# Patient Record
Sex: Female | Born: 1955 | Hispanic: No | Marital: Married | Smoking: Never smoker
Health system: Southern US, Community
[De-identification: ages and names within clinical notes are randomized; demographics above are authoritative.]

## PROBLEM LIST (undated history)

## (undated) DIAGNOSIS — K5790 Diverticulosis of intestine, part unspecified, without perforation or abscess without bleeding: Secondary | ICD-10-CM

## (undated) DIAGNOSIS — M1990 Unspecified osteoarthritis, unspecified site: Secondary | ICD-10-CM

## (undated) DIAGNOSIS — E039 Hypothyroidism, unspecified: Secondary | ICD-10-CM

## (undated) HISTORY — DX: Diverticulosis of intestine, part unspecified, without perforation or abscess without bleeding: K57.90

---

## 2006-12-02 ENCOUNTER — Encounter: Admission: RE | Admit: 2006-12-02 | Discharge: 2006-12-02 | Payer: Self-pay | Admitting: Hematology and Oncology

## 2007-08-14 ENCOUNTER — Encounter: Payer: Self-pay | Admitting: Internal Medicine

## 2007-08-16 ENCOUNTER — Encounter: Payer: Self-pay | Admitting: Internal Medicine

## 2007-08-24 ENCOUNTER — Encounter: Payer: Self-pay | Admitting: Internal Medicine

## 2007-08-26 ENCOUNTER — Encounter: Payer: Self-pay | Admitting: Internal Medicine

## 2008-05-11 DIAGNOSIS — Z8719 Personal history of other diseases of the digestive system: Secondary | ICD-10-CM | POA: Insufficient documentation

## 2008-05-11 DIAGNOSIS — K449 Diaphragmatic hernia without obstruction or gangrene: Secondary | ICD-10-CM | POA: Insufficient documentation

## 2008-05-11 DIAGNOSIS — R1013 Epigastric pain: Secondary | ICD-10-CM | POA: Insufficient documentation

## 2008-05-16 ENCOUNTER — Ambulatory Visit: Payer: Self-pay | Admitting: Internal Medicine

## 2008-05-30 ENCOUNTER — Other Ambulatory Visit: Admission: RE | Admit: 2008-05-30 | Discharge: 2008-05-30 | Payer: Self-pay | Admitting: Obstetrics and Gynecology

## 2008-06-01 ENCOUNTER — Telehealth: Payer: Self-pay | Admitting: Internal Medicine

## 2008-06-04 ENCOUNTER — Encounter: Payer: Self-pay | Admitting: Internal Medicine

## 2008-06-04 ENCOUNTER — Ambulatory Visit: Payer: Self-pay | Admitting: Internal Medicine

## 2008-06-06 ENCOUNTER — Encounter: Payer: Self-pay | Admitting: Internal Medicine

## 2008-07-24 ENCOUNTER — Ambulatory Visit (HOSPITAL_COMMUNITY): Admission: RE | Admit: 2008-07-24 | Discharge: 2008-07-24 | Payer: Self-pay | Admitting: Gynecology

## 2008-07-27 ENCOUNTER — Ambulatory Visit: Admission: RE | Admit: 2008-07-27 | Discharge: 2008-07-27 | Payer: Self-pay | Admitting: Internal Medicine

## 2008-08-13 HISTORY — PX: LAPAROSCOPIC CHOLECYSTECTOMY: SUR755

## 2008-08-28 ENCOUNTER — Encounter: Payer: Self-pay | Admitting: Gynecology

## 2008-08-28 ENCOUNTER — Ambulatory Visit (HOSPITAL_COMMUNITY): Admission: RE | Admit: 2008-08-28 | Discharge: 2008-08-28 | Payer: Self-pay | Admitting: Gynecology

## 2008-12-18 ENCOUNTER — Encounter (INDEPENDENT_AMBULATORY_CARE_PROVIDER_SITE_OTHER): Payer: Self-pay | Admitting: Interventional Radiology

## 2008-12-18 ENCOUNTER — Ambulatory Visit (HOSPITAL_COMMUNITY): Admission: RE | Admit: 2008-12-18 | Discharge: 2008-12-18 | Payer: Self-pay | Admitting: Hematology and Oncology

## 2009-06-20 ENCOUNTER — Encounter: Admission: RE | Admit: 2009-06-20 | Discharge: 2009-06-20 | Payer: Self-pay | Admitting: Hematology and Oncology

## 2009-07-13 HISTORY — PX: COLONOSCOPY: SHX174

## 2009-07-17 ENCOUNTER — Encounter: Admission: RE | Admit: 2009-07-17 | Discharge: 2009-07-17 | Payer: Self-pay | Admitting: Internal Medicine

## 2009-08-21 ENCOUNTER — Other Ambulatory Visit: Admission: RE | Admit: 2009-08-21 | Discharge: 2009-08-21 | Payer: Self-pay | Admitting: Obstetrics and Gynecology

## 2009-09-10 ENCOUNTER — Ambulatory Visit (HOSPITAL_COMMUNITY): Admission: RE | Admit: 2009-09-10 | Discharge: 2009-09-10 | Payer: Self-pay | Admitting: Obstetrics and Gynecology

## 2010-03-13 ENCOUNTER — Ambulatory Visit (HOSPITAL_COMMUNITY): Admission: RE | Admit: 2010-03-13 | Discharge: 2010-03-13 | Payer: Self-pay | Admitting: Internal Medicine

## 2010-05-22 ENCOUNTER — Encounter: Admission: RE | Admit: 2010-05-22 | Discharge: 2010-05-22 | Payer: Self-pay | Admitting: Internal Medicine

## 2010-07-25 ENCOUNTER — Encounter
Admission: RE | Admit: 2010-07-25 | Discharge: 2010-07-25 | Payer: Self-pay | Source: Home / Self Care | Attending: Internal Medicine | Admitting: Internal Medicine

## 2010-08-03 ENCOUNTER — Encounter: Payer: Self-pay | Admitting: Internal Medicine

## 2010-08-28 ENCOUNTER — Other Ambulatory Visit (HOSPITAL_COMMUNITY)
Admission: RE | Admit: 2010-08-28 | Discharge: 2010-08-28 | Disposition: A | Discharge status: Home / Self Care | Payer: 59 | Source: Ambulatory Visit | Attending: Obstetrics and Gynecology | Admitting: Obstetrics and Gynecology

## 2010-08-28 ENCOUNTER — Other Ambulatory Visit: Payer: Self-pay | Admitting: Obstetrics and Gynecology

## 2010-08-28 DIAGNOSIS — Z01419 Encounter for gynecological examination (general) (routine) without abnormal findings: Secondary | ICD-10-CM | POA: Insufficient documentation

## 2010-10-01 IMAGING — US US PELVIS COMPLETE MODIFY
1 series · 14 of 25 positions shown · non-contrast
Comparison: July 24, 2008

CLINICAL DATA: Fibroids

TRANSABDOMINAL AND TRANSVAGINAL ULTRASOUND OF PELVIS
TECHNIQUE: Both transabdominal and transvaginal ultrasound
examinations of the pelvis were performed including evaluation of
the uterus, ovaries, adnexal regions, and pelvic cul-de-sac.

[Series 1: us pelvis complete modify · 0.21mm/px · 14 of 63 slices shown]
[im 1/63]
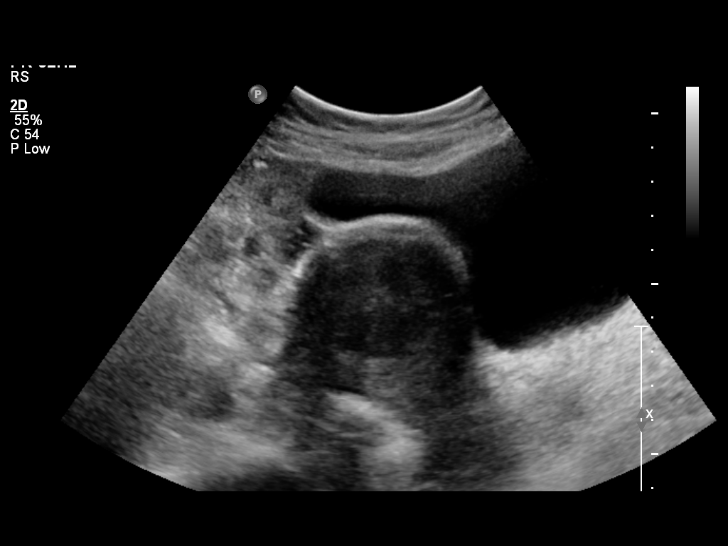
[im 6/63]
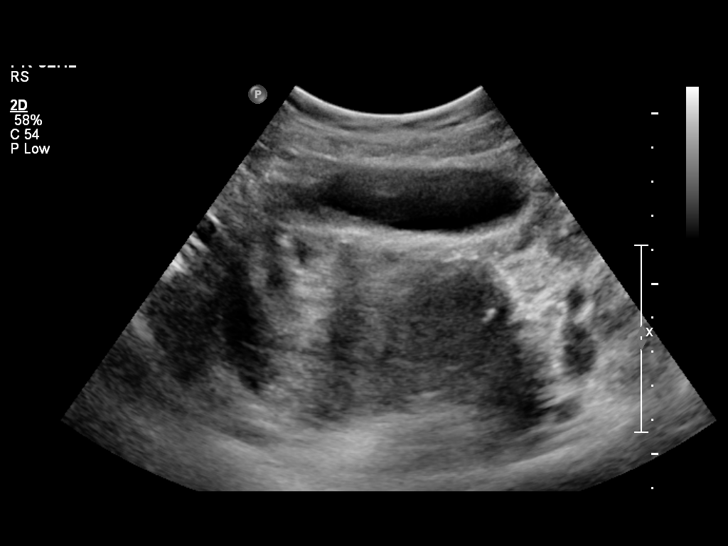
[im 11/63]
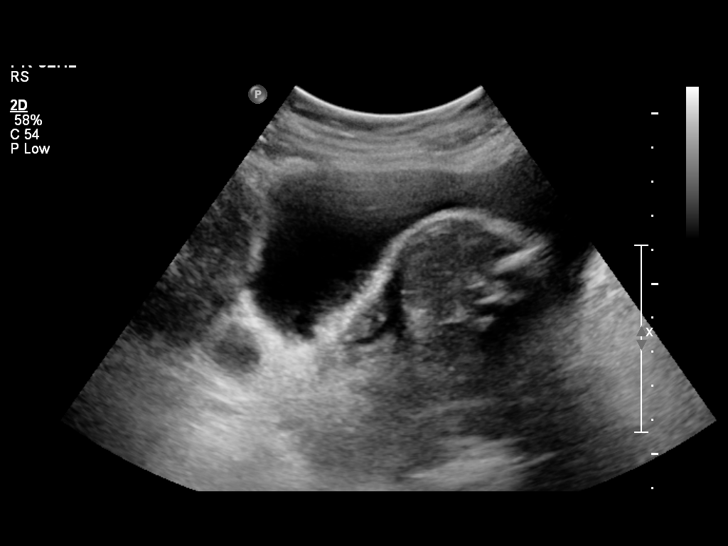
[im 16/63]
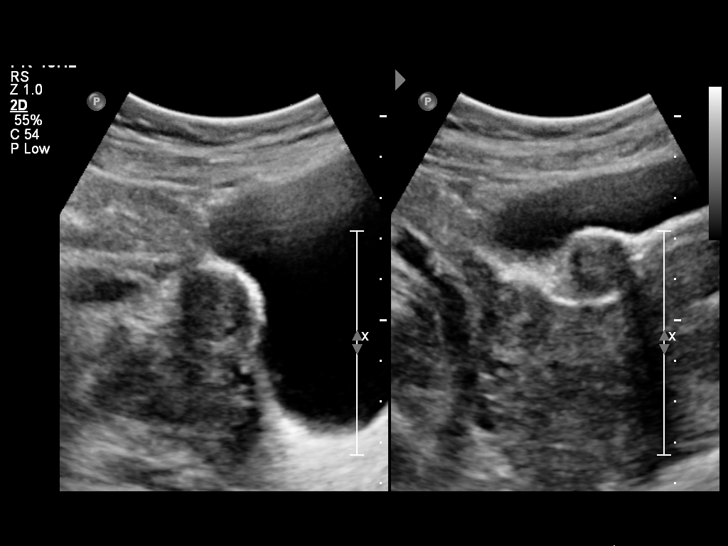
[im 21/63]
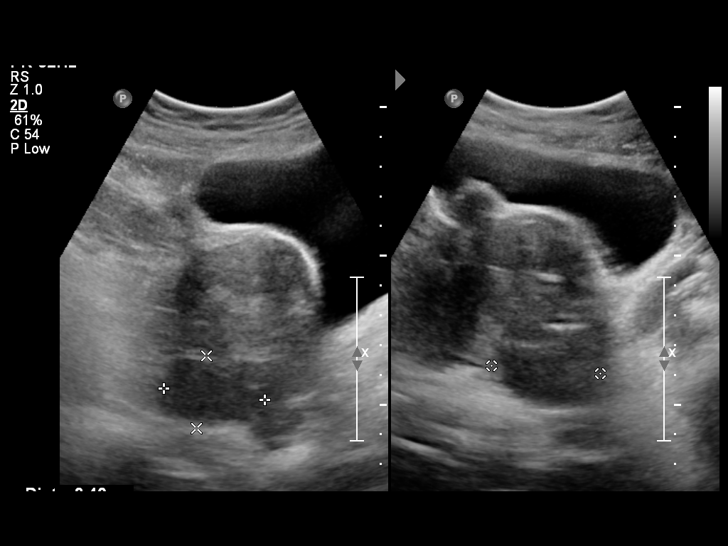
[im 24/63]
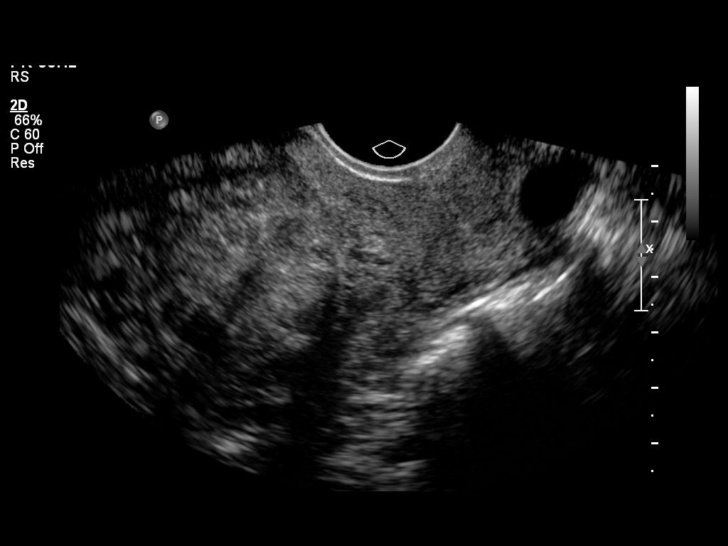
[im 29/63]
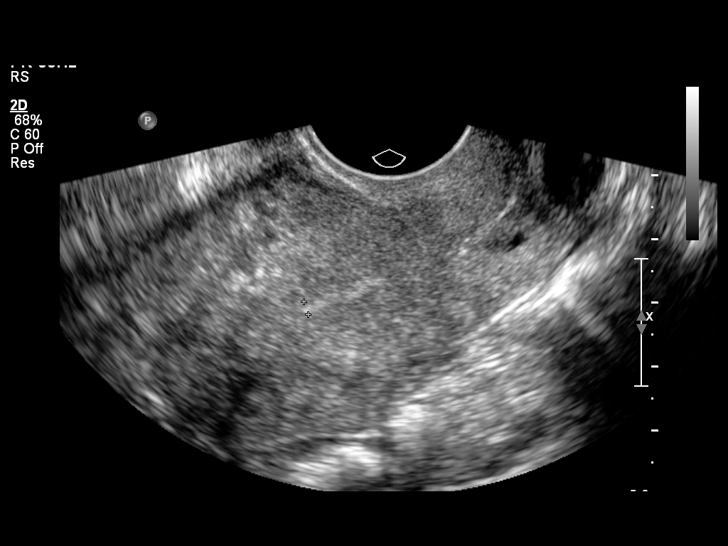
[im 34/63]
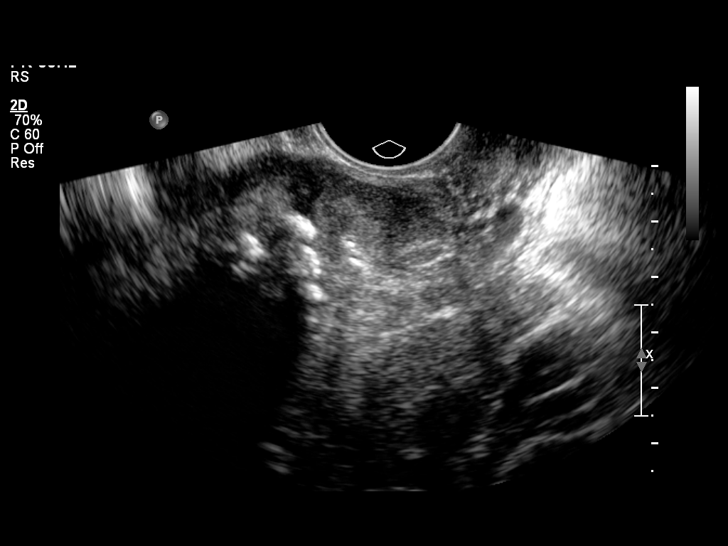
[im 39/63]
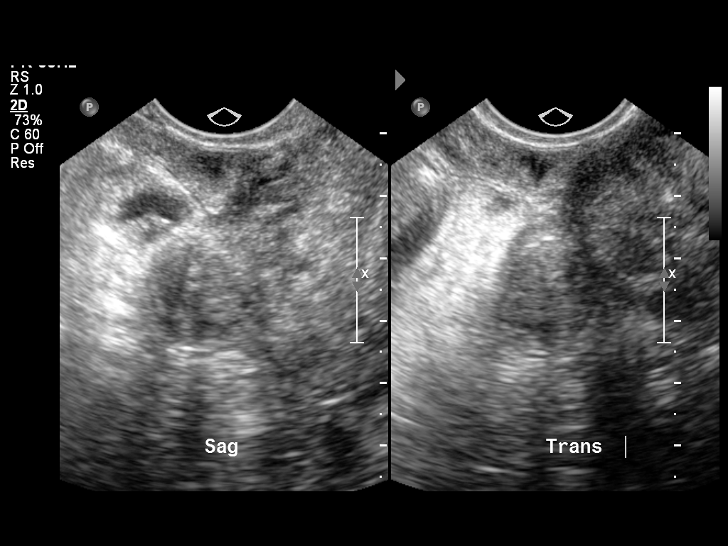
[im 42/63]
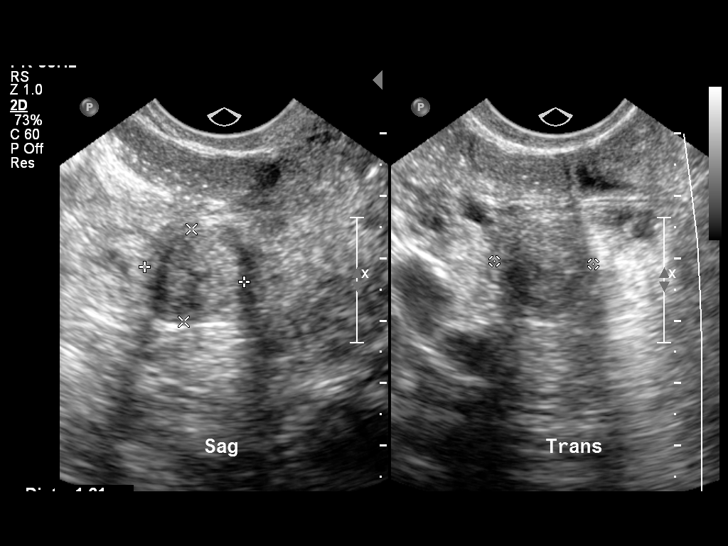
[im 47/63]
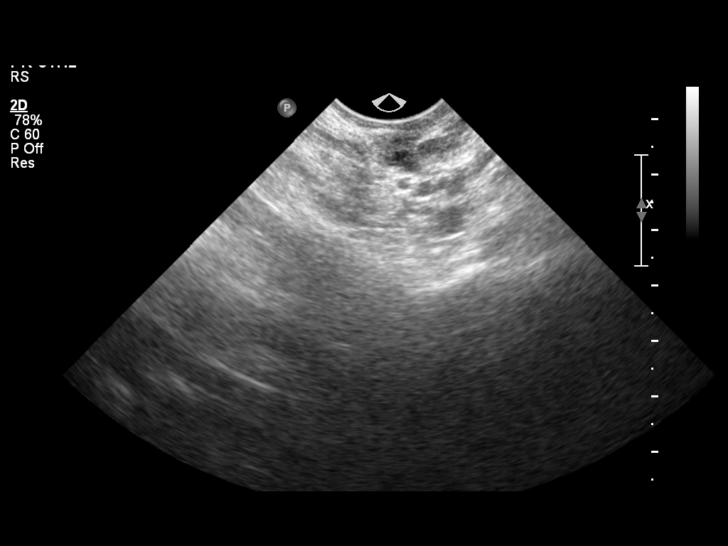
[im 52/63]
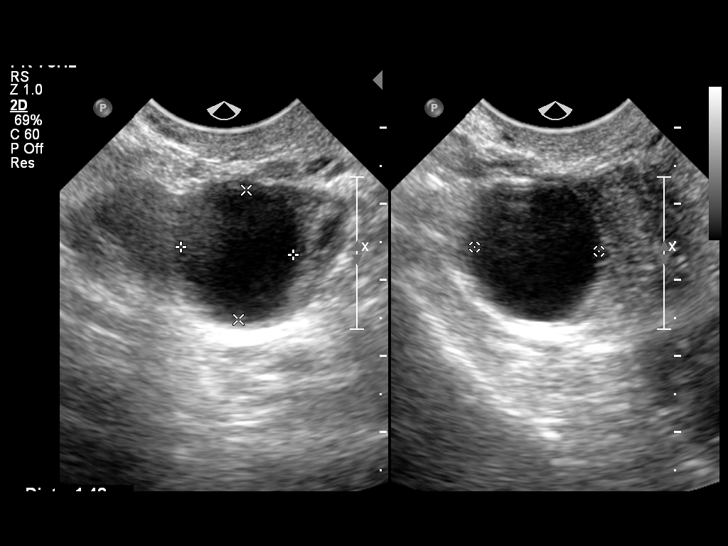
[im 57/63]
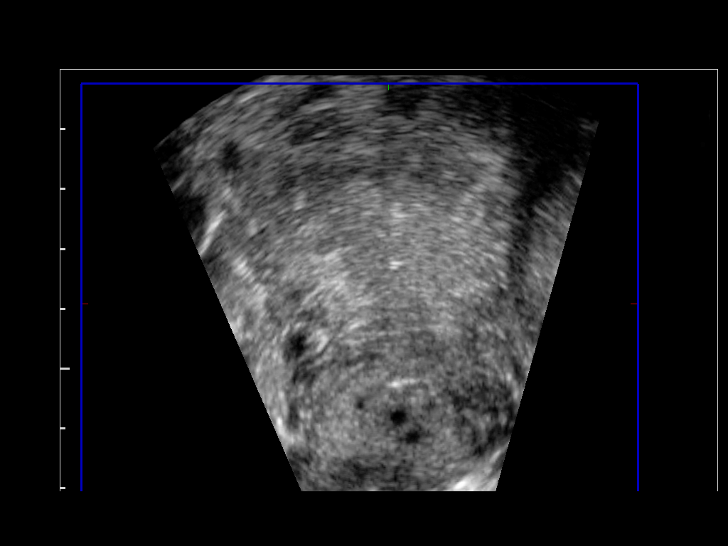
[im 63/63]
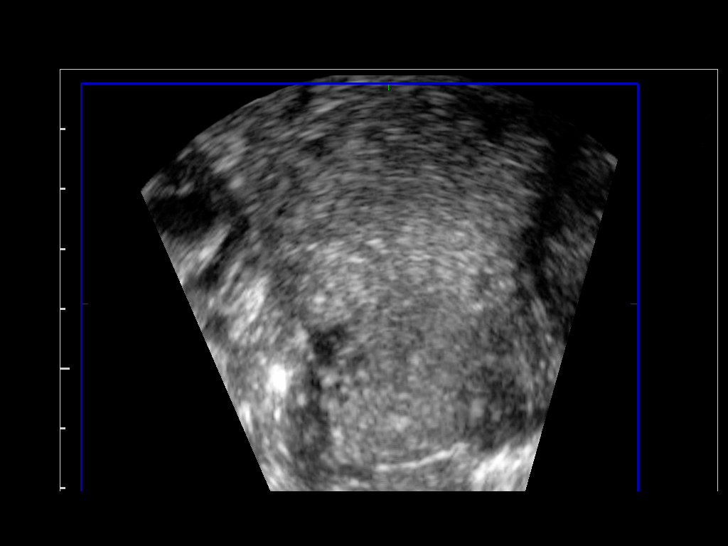

[14 of 25 positions shown; findings below may reference images not displayed]

FINDINGS: The uterus is at the  upper limits of normal in overall
size, measuring 8.6 x 5.5 x 8.3 cm.  Again noted is a 6 cm
myometrial fibroid at the left fundus, not significantly changed.
Two smaller fibroids are noted at the central anterior fundus which
are broad-based pedunculated and each measure 1.6 cm in greatest
dimension.  The endometrial stripe is thin and homogeneous,
measuring 2 mm in width.

Both ovaries have a normal size and appearance.  The right ovary
measures 4.1 x 2.0 x 1.7 cm, and the left ovary measures 2.1 x
x 1.5 cm.
IMPRESSION: There has been no significant interval change in the size of the 6
cm fibroid at the left fundus.  Two smaller fibroids are also noted
in the adjacent central fundus today.

## 2010-10-21 ENCOUNTER — Other Ambulatory Visit (HOSPITAL_COMMUNITY): Payer: Self-pay | Admitting: Internal Medicine

## 2010-10-21 DIAGNOSIS — E042 Nontoxic multinodular goiter: Secondary | ICD-10-CM

## 2010-10-24 ENCOUNTER — Other Ambulatory Visit (HOSPITAL_COMMUNITY): Payer: 59

## 2010-10-28 LAB — HEMOGLOBIN AND HEMATOCRIT, BLOOD: HCT: 37.3 % (ref 36.0–46.0)

## 2010-11-25 NOTE — Consult Note (Signed)
NAMEMarland Saunders  DAYLAH, SAYAVONG NO.:  [id number]   MEDICAL RECORD NO.:  [id number]          PATIENT TYPE:  OUT   LOCATION:  GYN                          FACILITY:  [facility]   PHYSICIAN:  De Blanch, M.D.DATE OF BIRTH:  08-05-1955   DATE OF CONSULTATION:  DATE OF DISCHARGE:                                 CONSULTATION   CHIEF COMPLAINT:  Endometrial polyp, fibroids, ovarian cyst.   HISTORY OF PRESENT ILLNESS:  A 55 year old white married female, gravida  3, para 2, who seeks second opinion regarding management of the chief  complaint noted above.   The patient apparently had presented for a routine gynecologic  examination on May 30, 2008, to Dr. Iven Finn.  A uterine or  adnexal mass was identified and the patient subsequently had undergone a  ultrasound which revealed uterus to measure 9.3 x 6 cm with a 7.1-cm  transverse diameter.  Extending from the uterus from the left aspect the  uterine fundus is a 6.9 x 5.5 x 5.5-cm mass consistent with a  pedunculated fibroid.  In addition, there was endometrial mass  identified and a right ovarian cyst measuring 2.8 x 1.8 x 2.7 cm.  Subsequently, the patient had a sonohystogram demonstrating a polyp.   The patient reports that she has had no significant gynecologic  problems.  Apparently, in her last pregnancy 15 years ago, she was known  to have a fibroid.  She denies any past history of abnormal bleeding or  abnormal Pap smear.  She continues to have regular cyclic menstrual  periods.   PAST MEDICAL HISTORY:   MEDICAL ILLNESSES:  None.   PAST SURGICAL HISTORY:  Laparoscopic cholecystectomy, February 2009.   CURRENT MEDICATIONS:  None.   DRUG ALLERGIES:  NONE.   SOCIAL HISTORY:  The patient is married.  Her husband is a Runner, broadcasting/film/video.  She is a homemaker.  She does not smoke.   FAMILY HISTORY:  Negative for breast, colon, and ovarian cancer.   REVIEW OF SYSTEMS:  Negative except as noted  above.   PHYSICAL EXAM:  Height 5 feet 6.  Weight 155 pounds.  Blood pressure  136/89.  Pulse 76.  GENERAL:  The patient is a healthy white female in no acute distress.  HEENT:  Negative.  NECK:  Supple without thyromegaly.  There is no supraclavicular or  inguinal adenopathy.  ABDOMEN:  Soft, nontender.  No mass, organomegaly, ascites, or hernias  are noted.  All laparoscopic scars are well healed.  PELVIC EXAM:  EG, BUS vagina, bladder, and urethra are normal.  Cervix  is normal and parous.  The uterus is irregular approximately in  aggregate about 10 to 12 gestation week size with a predominant area on  the left fundus.   IMPRESSION:  1. Uterine fibroids, asymptomatic.  2. Endometrial polyp.  3. Ovarian cyst.   Regarding the fibroids, I do not think any further evaluation or therapy  is necessary.  With regard to her ovarian cyst, this is most likely a  functional cyst and therefore would repeat the ultrasound in  approximately 8 weeks to be certain that it is resolving.  With regard to her endometrial polyp, I would recommend she undergo  hysteroscopy and D and C.  This will be scheduled for August 28, 2008,  as an outpatient.      De Blanch, M.D.  Electronically Signed     DC/MEDQ  D:  07/27/2008  T:  07/27/2008  Job:  [id number]   cc:   Telford Nab, R.N.  [address]  [place], [place] [address]

## 2010-11-25 NOTE — Op Note (Signed)
Sherri, Saunders NO.:  [id number]   MEDICAL RECORD NO.:  [id number]          PATIENT TYPE:  AMB   LOCATION:  DAY                          FACILITY:  [facility]   PHYSICIAN:  De Blanch, M.D.DATE OF BIRTH:  [birth year]   DATE OF PROCEDURE:  DATE OF DISCHARGE:                               OPERATIVE REPORT   PREOPERATIVE DIAGNOSIS:  Endometrial polyp.   POSTOPERATIVE DIAGNOSIS:  Endometrial polyp.   PROCEDURE:  Diagnostic hysteroscopy, dilatation and curettage.   ANESTHESIA:  General with LMA.   SURGICAL FINDINGS:  Under anesthesia, the patient was examined.  She had  a uterus which was mid plane with an approximately 6 cm fibroid on the  anterior fundus.  Under hysteroscopic visualization, a polyp was noted  on the right wall of the uterine fundus.  After D&C was performed, the  patient was re-hysteroscoped and the polyp was absent.   PROCEDURE:  The patient was brought to the operating room and after  satisfactory general anesthesia, she was placed in a modified lithotomy  position in [place] stirrups.  The perineum and vagina were prepped with  Betadine and the patient was draped.  The bladder was emptied with a  straight catheter.  A weighted speculum was placed in the posterior  vagina.  A Deaver elevated the bladder and the cervix was grasped with a  tenaculum.  The uterus was sounded to approximately 8 cm anterior and  then dilated to a 23 Pratt dilator.  the hysteroscope was inserted to  the endocervical canal and then using distending medium of a sorbitol,  the endometrial cavity was evaluated with the above-noted findings.   The hysteroscope was removed.  The cervix was further dilated and then  Randall stone forceps were placed in the uterus, grasping the polyp and  removing it in pieces.  Sharp curettage was then performed, removing a  small amount of additional tissue.   The hysteroscope was reinserted and after blood was cleared from  the  endometrial cavity, it was found that the polyp was completely removed.   The retractors and tenaculum were removed.  The patient was awakened  from anesthesia and taken to the recovery room in satisfactory  condition.  Sponge, needle and instrument counts were correct x2.      De Blanch, M.D.  Electronically Signed     DC/MEDQ  D:  08/28/2008  T:  08/28/2008  Job:  [id number]   cc:   Telford Nab, R.N.  [address]  [place], [place] [address]

## 2011-02-15 ENCOUNTER — Emergency Department (HOSPITAL_COMMUNITY)
Admission: EM | Admit: 2011-02-15 | Discharge: 2011-02-15 | Disposition: A | Discharge status: Home / Self Care | Payer: 59 | Attending: Emergency Medicine | Admitting: Emergency Medicine

## 2011-02-15 DIAGNOSIS — Z0389 Encounter for observation for other suspected diseases and conditions ruled out: Secondary | ICD-10-CM | POA: Insufficient documentation

## 2011-02-15 LAB — URINALYSIS, ROUTINE W REFLEX MICROSCOPIC
Bilirubin Urine: NEGATIVE
Hgb urine dipstick: NEGATIVE
Ketones, ur: NEGATIVE mg/dL
Specific Gravity, Urine: 1.015 (ref 1.005–1.030)
Urobilinogen, UA: 1 mg/dL (ref 0.0–1.0)
pH: 5.5 (ref 5.0–8.0)

## 2011-02-15 LAB — DIFFERENTIAL
Eosinophils Absolute: 0.1 10*3/uL (ref 0.0–0.7)
Eosinophils Relative: 1 % (ref 0–5)
Lymphocytes Relative: 10 % — ABNORMAL LOW (ref 12–46)
Lymphs Abs: 1.4 10*3/uL (ref 0.7–4.0)
Monocytes Relative: 10 % (ref 3–12)

## 2011-02-15 LAB — COMPREHENSIVE METABOLIC PANEL
ALT: 18 U/L (ref 0–35)
Albumin: 3.8 g/dL (ref 3.5–5.2)
Alkaline Phosphatase: 71 U/L (ref 39–117)
Chloride: 101 mEq/L (ref 96–112)
Potassium: 3.6 mEq/L (ref 3.5–5.1)
Sodium: 139 mEq/L (ref 135–145)
Total Protein: 7.4 g/dL (ref 6.0–8.3)

## 2011-02-15 LAB — URINE MICROSCOPIC-ADD ON

## 2011-02-15 LAB — CBC
HCT: 39.3 % (ref 36.0–46.0)
MCH: 28.6 pg (ref 26.0–34.0)
MCV: 84.5 fL (ref 78.0–100.0)
RDW: 13.5 % (ref 11.5–15.5)
WBC: 14 10*3/uL — ABNORMAL HIGH (ref 4.0–10.5)

## 2011-02-16 LAB — URINE CULTURE: Culture: NO GROWTH

## 2011-03-04 ENCOUNTER — Encounter (INDEPENDENT_AMBULATORY_CARE_PROVIDER_SITE_OTHER): Payer: Self-pay | Admitting: Internal Medicine

## 2011-03-04 ENCOUNTER — Ambulatory Visit (INDEPENDENT_AMBULATORY_CARE_PROVIDER_SITE_OTHER): Payer: 59 | Admitting: Internal Medicine

## 2011-03-04 VITALS — BP 110/70 | HR 68 | Temp 97.8°F | Ht 66.0 in | Wt 158.0 lb

## 2011-03-04 DIAGNOSIS — K5730 Diverticulosis of large intestine without perforation or abscess without bleeding: Secondary | ICD-10-CM

## 2011-03-04 DIAGNOSIS — K5790 Diverticulosis of intestine, part unspecified, without perforation or abscess without bleeding: Secondary | ICD-10-CM

## 2011-03-04 NOTE — Patient Instructions (Signed)
Continue high fiber diet. Align or Probiotic one capsule  Daily.

## 2011-03-04 NOTE — Progress Notes (Unsigned)
  Job [id number]

## 2011-03-05 NOTE — Progress Notes (Unsigned)
PRESENTING COMPLAINT:  Recent bout of diverticulitis.  HISTORY OF PRESENT ILLNESS:  The patient is a 55 year old Caucasian female who is being seen at request by her husband Dr. Earma Reading for GI evaluation.  The patient was last seen by me in February 2009 for upper abdominal pain and went on to have laparoscopic cholecystectomy.   She states she was doing well until about 3 weeks ago when she developed pain in her left upper quadrant.  This was mild pain and more or less constant, but over the next 2-3 days it increased in intensity and migrated to the left lower quadrant.  Pain became quite intense.  By February 15, 2011, which was day 3 of her pain, the pain became quite intense which she describes as a score of 9.  She did not experience any associated symptoms of nausea, vomiting, dysuria, hematuria, fever, or chills.  Her husband, Dr. Ubaldo Glassing took her to [facility] Emergency Room.  The patient waited for 4 hours.  She had her lab studies drawn, and she apparently was offered pain medication prior to being seen by a physician.  She and her husband got frustrated and left.  She was taken to the emergency room at [facility] in [facility].  She had abdominopelvic CT which showed changes of sigmoid diverticulitis.  She was felt to be stable to go home.  She was sent home on Cipro and Flagyl which she took for 10 days.  Every day that she was on antibiotic, she felt better.  By the time she finished treatment, her pain was completely gone.  She did experience metallic taste and nausea presumed to be due to metronidazole but did not have any other side effects.  This is the first episode of diverticulitis that she has ever experienced.   Presently, she is pain free.  She does not even have soreness.  She has noted change in the color of her stools.  It is dark greenish at a frequency of one a day.  Typically, she may have a bowel movement every other day.  She denies melena or rectal  bleeding.  Her last colonoscopy was by Dr. Lina Sar in November 2009 which is reviewed under past medical history.  She has a good appetite.  She denies anorexia or weight loss.  CURRENT MEDICATIONS:   1. Calcium/vitamin D 500/200 one tablet twice daily. 2. She does not take any OTC medications.  PAST MEDICAL HISTORY:  She presented with epigastric pain in February 2009.  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  Her H. pylori serology was negative.  Biopsy from GE junction was negative for Barrett.  She then had upper abdominal ultrasound which showed multiple stones.  She had laparoscopic cholecystectomy by Dr. Cleotis Nipper also in February 2009, and her abdominal pain resolved.   She had a screening colonoscopy by Dr. Lina Sar in November 2009.  She had two polyps snared from a proximal colon.  One polyp was lost and the other one was 5-6 mm and was a tubular adenoma.  Followup colonoscopy was advised in November 2014.  This study also reveals pancolonic diverticulosis.  She had few diverticula scattered throughout the colon.   She has history of uterine fibroids.  She had a hysteroscopy in February 2010 with removal of endometrial polyp which was benign.  ALLERGIES:  NK.  FAMILY HISTORY:  Mother died at age 45.  her mother was treated in [place], [place].  She  had laparotomy and it was opened and closed.  Apparently family was never told as to the precise diagnosis.  They felt that she either had carcinomatosis or peritonitis.  Father lived to be 91 and died of MI.  She has two sisters living and they both have had their gallbladders removed one at 56 years old another one is 53.  She had a half-sister died of hemorrhagic CVA at age 10.  SOCIAL HISTORY:  She is married.  Her husband is an Best boy, Dr. Earma Reading currently based in [place], [place].  She has two sons both doing quite well.  One is in high school.  She is currently a housewife.  She has never  smoked cigarettes or drank alcohol.  PHYSICAL EXAMINATION:   VITAL SIGNS:  She weighs 158 pounds of 71.68 kg.  She is 66 inches tall.  Pulse 68 per minute and regular, blood pressure 110/70, temperature is 97.8, respiratory rate is 18.  HEENT:  Conjunctivae are pink.  Sclerae nonicteric.  Oropharyngeal mucosa is normal.  Dentition in satisfactory condition.  NECK:  No neck masses or thyromegaly noted.  CARDIAC:  Regular rhythm.  Normal S1 and S2.  No murmur or gallop noted.  LUNGS:  Clear to auscultation.  Abdomen is symmetrical.  Bowel sounds are normal on palpation.  ABDOMEN:   Soft and nontender without organomegaly or masses.  RECTAL:   Deferred.  EXTREMITIES:  No peripheral edema or clubbing noted.  LABORATORY DATA:  Lab data from February 15, 2011, done at [facility] WBC 14.0, H and H 13.3 an 39.3, platelet count 181,000.  She had 80 segs.  Urinalysis revealed cloudy urine with gravity of 1.015 and revealed a large amount of leukocytes and dip stick microscopy not available.  Her electrolytes were normal.  Glucose was 133, BUN 13, creatinine 0.61, total bilirubin 0.5, AP 71, SGOT 13, SGPT 18, total protein 7.4 with albumin of 3.8.  Calcium was 9.6.  Her lipase was 32.   Urine culture reveals no growth.   CT films not available for review.  Report provided by Dr. Ubaldo Glassing.  The study showed sigmoid diverticulitis.  ASSESSMENT:  Mila suffered bout of diverticulitis and responded to antibiotic therapy.  It appears she had uncomplicated diverticulitis, and this is the first episode.  Her abdominal exam is normal.  Since this is her first episode and she has fully recovered, she does not need any specific interventions other than what is recommended with under recommend what is under recommendations.  Her last colonoscopy was in November 2009 and therefore there is no need for this to be repeated.  Her stool should return to her baseline and normal in a matter of few days or couple of weeks.  Change would  appear to be due to antibiotic use.  RECOMMENDATIONS:  We will request a colonoscopy records from [facility].   We will review abdominal pelvic CT via [facility].   She should continue with high-fiber diet and may add fiber supplement 3-4 g per day.   Take Align 1 capsule or probiotic 1 capsule daily.   She will return for office visit in 6 months.  However, if her symptoms recur, she should immediately get in touch with [place].   I appreciate the opportunity to participate the care of nice lady.

## 2011-03-09 NOTE — Progress Notes (Signed)
Sherri Saunders  Description:  55 year old female  03/04/2011 9:26 PM Documentation Provider:  Malissa Hippo, MD  MRN: [id number] Department:  [facility]. Lionel December     Progress Notes     Malissa Hippo, MD 03/04/2011 9:27 PM Pended  Job BJ;[id number] Sherri Saunders 03/05/2011 8:24 AM Pended   Sherri Saunders 03/05/2011 8:39 AM Pended  PRESENTING COMPLAINT: Recent bout of diverticulitis.  HISTORY OF PRESENT ILLNESS: The patient is a 55 year old Caucasian female who is being seen at request by her husband Dr. Earma Reading for GI evaluation. The patient was last seen by me in February 2009 for upper abdominal pain and went on to have laparoscopic cholecystectomy.  She states she was doing well until about 3 weeks ago when she developed pain in her left upper quadrant. This was mild pain and more or less constant, but over the next 2-3 days it increased in intensity and migrated to the left lower quadrant. Pain became quite intense. By February 15, 2011, which was day 3 of her pain, the pain became quite intense which she describes as a score of 9. She did not experience any associated symptoms of nausea, vomiting, dysuria, hematuria, fever, or chills. Her husband, Dr. Ubaldo Glassing took her to [facility] Emergency Room. The patient waited for 4 hours. She had her lab studies drawn, and she apparently was offered pain medication prior to being seen by a physician. She and her husband got frustrated and left. She was taken to the emergency room at [facility] in [facility]. She had abdominopelvic CT which showed changes of sigmoid diverticulitis. She was felt to be stable to go home. She was sent home on Cipro and Flagyl which she took for 10 days. Every day that she was on antibiotic, she felt better. By the time she finished treatment, her pain was completely gone. She did experience metallic taste and nausea presumed to be due to metronidazole but did not have any  other side effects. This is the first episode of diverticulitis that she has ever experienced.  Presently, she is pain free. She does not even have soreness. She has noted change in the color of her stools. It is dark greenish at a frequency of one a day. Typically, she may have a bowel movement every other day. She denies melena or rectal bleeding. Her last colonoscopy was by Dr. Lina Sar in November 2009 which is reviewed under past medical history. She has a good appetite. She denies anorexia or weight loss.  CURRENT MEDICATIONS:  1. Calcium/vitamin D 500/200 one tablet twice daily. 2. She does not take any OTC medications. PAST MEDICAL HISTORY: She presented with epigastric pain in February 2009. 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 Her H. pylori serology was negative. Biopsy from GE junction was negative for Barrett. She then had upper abdominal ultrasound which showed multiple stones. She had laparoscopic cholecystectomy by Dr. Cleotis Nipper also in February 2009, and her abdominal pain resolved.  She had a screening colonoscopy by Dr. Lina Sar in November 2009. She had two polyps snared from a proximal colon. One polyp was lost and the other one was 5-6 mm and was a tubular adenoma. Followup colonoscopy was advised in November 2014. This study also reveals pancolonic diverticulosis. She had few diverticula scattered throughout the colon.  She has history of uterine fibroids. She had a hysteroscopy in February 2010 with removal of endometrial polyp  which was benign.  ALLERGIES: NK.  FAMILY HISTORY: Mother died at age 56. her mother was treated in [place], [place]. She had laparotomy and it was opened and closed. Apparently family was never told as to the precise diagnosis. They felt that she either had carcinomatosis or peritonitis. Father lived to be 83 and died of MI. She has two sisters living and they both have had their gallbladders removed one at 56 years old  another one is 56. She had a half-sister died of hemorrhagic CVA at age 56.  SOCIAL HISTORY: She is married. Her husband is an Best boy, Dr. Earma Reading currently based in [place], [place]. She has two sons both doing quite well. One is in high school. She is currently a housewife. She has never smoked cigarettes or drank alcohol.  PHYSICAL EXAMINATION:  VITAL SIGNS: She weighs 158 pounds of 71.68 kg. She is 66 inches tall. Pulse 68 per minute and regular, blood pressure 110/70, temperature is 97.8, respiratory rate is 18.  HEENT: Conjunctivae are pink. Sclerae nonicteric. Oropharyngeal mucosa is normal. Dentition in satisfactory condition.  NECK: No neck masses or thyromegaly noted.  CARDIAC: Regular rhythm. Normal S1 and S2. No murmur or gallop noted.  LUNGS: Clear to auscultation. Abdomen is symmetrical. Bowel sounds are normal on palpation.  ABDOMEN: Soft and nontender without organomegaly or masses.  RECTAL: Deferred.  EXTREMITIES: No peripheral edema or clubbing noted.  LABORATORY DATA: Lab data from February 15, 2011, done at [facility] WBC 14.0, H and H 13.3 an 39.3, platelet count 181,000. She had 80 segs. Urinalysis revealed cloudy urine with gravity of 1.015 and revealed a large amount of leukocytes and dip stick microscopy not available. Her electrolytes were normal. Glucose was 133, BUN 13, creatinine 0.61, total bilirubin 0.5, AP 71, SGOT 13, SGPT 18, total protein 7.4 with albumin of 3.8. Calcium was 9.6. Her lipase was 32.  Urine culture reveals no growth.  CT films not available for review. Report provided by Dr. Ubaldo Glassing. The study showed sigmoid diverticulitis.  ASSESSMENT: Sherri Saunders suffered bout of diverticulitis and responded to antibiotic therapy. It appears she had uncomplicated diverticulitis, and this is the first episode. Her abdominal exam is normal. Since this is her first episode and she has fully recovered, she does not need any specific interventions other than what is  recommended with under recommend what is under recommendations. Her last colonoscopy was in November 2009 and therefore there is no need for this to be repeated. Her stool should return to her baseline and normal in a matter of few days or couple of weeks. Change would appear to be due to antibiotic use.  RECOMMENDATIONS: We will request a colonoscopy records from [facility].  We will review abdominal pelvic CT via [facility].  She should continue with high-fiber diet and may add fiber supplement 3-4 g per day.  Take Align 1 capsule or probiotic 1 capsule daily.  She will return for office visit in 6 months. However, if her symptoms recur, she should immediately get in touch with [place].  I appreciate the opportunity to participate the care of nice lady.

## 2011-05-29 ENCOUNTER — Other Ambulatory Visit (HOSPITAL_COMMUNITY): Payer: Self-pay | Admitting: Orthopaedic Surgery

## 2011-05-29 DIAGNOSIS — M25561 Pain in right knee: Secondary | ICD-10-CM

## 2011-05-30 ENCOUNTER — Other Ambulatory Visit (HOSPITAL_COMMUNITY): Payer: Self-pay | Admitting: Orthopaedic Surgery

## 2011-05-30 ENCOUNTER — Ambulatory Visit (HOSPITAL_COMMUNITY)
Admission: RE | Admit: 2011-05-30 | Discharge: 2011-05-30 | Disposition: A | Discharge status: Home / Self Care | Payer: 59 | Source: Ambulatory Visit | Attending: Orthopaedic Surgery | Admitting: Orthopaedic Surgery

## 2011-05-30 DIAGNOSIS — R609 Edema, unspecified: Secondary | ICD-10-CM | POA: Insufficient documentation

## 2011-05-30 DIAGNOSIS — M25561 Pain in right knee: Secondary | ICD-10-CM

## 2011-05-30 DIAGNOSIS — M6580 Other synovitis and tenosynovitis, unspecified site: Secondary | ICD-10-CM | POA: Insufficient documentation

## 2011-05-30 DIAGNOSIS — M25569 Pain in unspecified knee: Secondary | ICD-10-CM | POA: Insufficient documentation

## 2011-07-17 ENCOUNTER — Other Ambulatory Visit: Payer: Self-pay | Admitting: Internal Medicine

## 2011-07-17 DIAGNOSIS — Z1231 Encounter for screening mammogram for malignant neoplasm of breast: Secondary | ICD-10-CM

## 2011-08-25 ENCOUNTER — Ambulatory Visit: Payer: Self-pay

## 2011-08-26 ENCOUNTER — Ambulatory Visit
Admission: RE | Admit: 2011-08-26 | Discharge: 2011-08-26 | Disposition: A | Discharge status: Home / Self Care | Payer: 59 | Source: Ambulatory Visit | Attending: Internal Medicine | Admitting: Internal Medicine

## 2011-08-26 DIAGNOSIS — Z1231 Encounter for screening mammogram for malignant neoplasm of breast: Secondary | ICD-10-CM

## 2011-09-09 ENCOUNTER — Other Ambulatory Visit (HOSPITAL_COMMUNITY)
Admission: RE | Admit: 2011-09-09 | Discharge: 2011-09-09 | Disposition: A | Discharge status: Home / Self Care | Payer: 59 | Source: Ambulatory Visit | Attending: Obstetrics and Gynecology | Admitting: Obstetrics and Gynecology

## 2011-09-09 ENCOUNTER — Other Ambulatory Visit: Payer: Self-pay | Admitting: Obstetrics and Gynecology

## 2011-09-09 DIAGNOSIS — Z01419 Encounter for gynecological examination (general) (routine) without abnormal findings: Secondary | ICD-10-CM | POA: Insufficient documentation

## 2012-05-20 ENCOUNTER — Ambulatory Visit (HOSPITAL_COMMUNITY)
Admission: RE | Admit: 2012-05-20 | Discharge: 2012-05-20 | Disposition: A | Discharge status: Home / Self Care | Payer: 59 | Source: Ambulatory Visit | Attending: Internal Medicine | Admitting: Internal Medicine

## 2012-05-20 ENCOUNTER — Other Ambulatory Visit: Payer: Self-pay

## 2012-05-20 DIAGNOSIS — R001 Bradycardia, unspecified: Secondary | ICD-10-CM

## 2012-05-20 DIAGNOSIS — I059 Rheumatic mitral valve disease, unspecified: Secondary | ICD-10-CM

## 2012-05-20 DIAGNOSIS — I498 Other specified cardiac arrhythmias: Secondary | ICD-10-CM | POA: Insufficient documentation

## 2012-05-20 NOTE — Progress Notes (Signed)
  Echocardiogram 2D Echocardiogram has been performed.  Georgian Co 05/20/2012, 11:26 AM

## 2012-06-30 ENCOUNTER — Ambulatory Visit (INDEPENDENT_AMBULATORY_CARE_PROVIDER_SITE_OTHER): Payer: 59 | Admitting: Cardiology

## 2012-06-30 ENCOUNTER — Encounter: Payer: Self-pay | Admitting: Cardiology

## 2012-06-30 VITALS — BP 122/68 | HR 74 | Ht 66.0 in | Wt 159.1 lb

## 2012-06-30 DIAGNOSIS — R001 Bradycardia, unspecified: Secondary | ICD-10-CM

## 2012-06-30 DIAGNOSIS — I498 Other specified cardiac arrhythmias: Secondary | ICD-10-CM

## 2012-06-30 DIAGNOSIS — I459 Conduction disorder, unspecified: Secondary | ICD-10-CM

## 2012-06-30 NOTE — Progress Notes (Signed)
   Clinical Summary Ms. Preyer is a 56 y.o.female presenting for cardiology consultation, primary care is with Dr. Olena Leatherwood. She is here with her husband today, Dr. Ubaldo Glassing. She reports intermittent episodes of a feeling of pressure in her ears, similar to the sensation one feels when changing altitude in an airplane. It is at these times she has noted her heart rate decreases into the 40s, noted to be as low as 35 on one occasion. No associated hypotension or hypertension by report. She has had no definite dizziness or syncope with these events, furthermore no exertional chest pain or breathlessness. At baseline she described herself as active, no major limitation with ADLs were outdoor work.  Recent ECG from November reviewed showing sinus bradycardia at 53, with sinus arrhythmia. Echocardiogram also from November revealed LVEF 60-65% with grade 1 diastolic dysfunction, mild mitral regurgitation.  Reports no history of hypertension, cardiac disease, dysrhythmia. No known tick borne illnesses. No clear history of neurocardiogenic syncope. Does state that she has had an intermittent cough for the last few months. Also reported history of thyroid cyst although normal thyroid function.  No Known Allergies  Current Outpatient Prescriptions  Medication Sig Dispense Refill  . calcium-vitamin D (OSCAL WITH D) 500-200 MG-UNIT per tablet Take 1 tablet by mouth 2 (two) times daily.          Past Medical History  Diagnosis Date  . Diverticulosis     Past Surgical History  Procedure Date  . Laparoscopic cholecystectomy 08/2008  . Colonoscopy 2011    Dr. Juanda Chance  . Dilation and curettage of uterus 05/2009    Family History  Problem Relation Age of Onset  . Hypertension Mother   . Diabetes Sister   . Hypertension Sister   . Healthy Son   . Healthy Son     Social History Ms. Chavous reports that she has never smoked. She has never used smokeless tobacco. Ms. Mcraney reports that she  does not drink alcohol.  Review of Systems Reviewed and negative except as outlined above.  Physical Examination Filed Vitals:   06/30/12 0931  BP: 122/68  Pulse: 74   Filed Weights   06/30/12 0931  Weight: 159 lb 1.9 oz (72.176 kg)   Well-nourished woman in no acute distress. HEENT: Conjunctiva and lids normal, oropharynx clear. Neck: Supple, no elevated JVP or carotid bruits. Lungs: Clear to auscultation, nonlabored. Cardiac: Regular rate and rhythm, no significant murmur or gallop. No rub. Extremities: No pitting edema, distal pulses full. Skin: Warm and dry. Musculoskeletal: No kyphosis. Neuropsychiatric: Alert and oriented x3, affect appropriate.  Problem List and Plan   Bradycardia As described above, occurs episodically without known precipitant. Not associated with syncope or definite hypotension. Associated feeling of fullness in her ears as outlined. No chest pain or breathlessness. Baseline ECG reviewed, showed sinus bradycardia in the 50s, although normal intervals. Echocardiogram shows normal LV function, only mild mitral regurgitation. Not described in fashion consistent with definite neurocardiogenic etiology. Will obtain a seven-day monitor for further evaluation, reports episodes perhaps twice a week. Can arrange follow up depending on results.    Jonelle Sidle, M.D., F.A.C.C.

## 2012-06-30 NOTE — Patient Instructions (Addendum)
Your physician has recommended that you wear an event monitor. Event monitors are medical devices that record the heart's electrical activity. Doctors most often [place] these monitors to diagnose arrhythmias. Arrhythmias are problems with the speed or rhythm of the heartbeat. The monitor is a small, portable device. You can wear one while you do your normal daily activities. This is usually used to diagnose what is causing palpitations/syncope (passing out). YOU WILL WEAR THE MONITOR FOR A TOTAL OF SEVEN DAYS  WE WILL CALL YOU WITH THE RESULTS AND ARRANGE A FOLLOW UP  APPOINTMENT WITH DR MCDOWELL IF NEEDED

## 2012-06-30 NOTE — Assessment & Plan Note (Signed)
As described above, occurs episodically without known precipitant. Not associated with syncope or definite hypotension. Associated feeling of fullness in her ears as outlined. No chest pain or breathlessness. Baseline ECG reviewed, showed sinus bradycardia in the 50s, although normal intervals. Echocardiogram shows normal LV function, only mild mitral regurgitation. Not described in fashion consistent with definite neurocardiogenic etiology. Will obtain a seven-day monitor for further evaluation, reports episodes perhaps twice a week. Can arrange follow up depending on results.

## 2012-07-19 ENCOUNTER — Other Ambulatory Visit: Payer: Self-pay | Admitting: Cardiology

## 2012-07-19 ENCOUNTER — Other Ambulatory Visit: Payer: Self-pay | Admitting: *Deleted

## 2012-07-19 DIAGNOSIS — I459 Conduction disorder, unspecified: Secondary | ICD-10-CM

## 2012-11-04 ENCOUNTER — Other Ambulatory Visit: Payer: Self-pay | Admitting: Obstetrics and Gynecology

## 2012-11-04 ENCOUNTER — Other Ambulatory Visit (HOSPITAL_COMMUNITY)
Admission: RE | Admit: 2012-11-04 | Discharge: 2012-11-04 | Disposition: A | Discharge status: Home / Self Care | Payer: 59 | Source: Ambulatory Visit | Attending: Obstetrics and Gynecology | Admitting: Obstetrics and Gynecology

## 2012-11-04 DIAGNOSIS — Z01419 Encounter for gynecological examination (general) (routine) without abnormal findings: Secondary | ICD-10-CM | POA: Insufficient documentation

## 2012-11-04 DIAGNOSIS — Z1151 Encounter for screening for human papillomavirus (HPV): Secondary | ICD-10-CM | POA: Insufficient documentation

## 2012-11-08 ENCOUNTER — Other Ambulatory Visit: Payer: Self-pay

## 2012-11-08 DIAGNOSIS — Z1231 Encounter for screening mammogram for malignant neoplasm of breast: Secondary | ICD-10-CM

## 2012-12-21 ENCOUNTER — Other Ambulatory Visit: Payer: Self-pay | Admitting: Internal Medicine

## 2012-12-21 DIAGNOSIS — E042 Nontoxic multinodular goiter: Secondary | ICD-10-CM

## 2012-12-23 ENCOUNTER — Ambulatory Visit (HOSPITAL_COMMUNITY)
Admission: RE | Admit: 2012-12-23 | Discharge: 2012-12-23 | Disposition: A | Discharge status: Home / Self Care | Payer: 59 | Source: Ambulatory Visit | Attending: Internal Medicine | Admitting: Internal Medicine

## 2012-12-23 DIAGNOSIS — E042 Nontoxic multinodular goiter: Secondary | ICD-10-CM | POA: Insufficient documentation

## 2012-12-29 ENCOUNTER — Ambulatory Visit: Payer: 59

## 2013-01-23 ENCOUNTER — Ambulatory Visit: Payer: 59

## 2013-01-23 ENCOUNTER — Ambulatory Visit
Admission: RE | Admit: 2013-01-23 | Discharge: 2013-01-23 | Disposition: A | Discharge status: Home / Self Care | Payer: 59 | Source: Ambulatory Visit

## 2013-01-23 DIAGNOSIS — Z1231 Encounter for screening mammogram for malignant neoplasm of breast: Secondary | ICD-10-CM

## 2013-04-06 ENCOUNTER — Encounter: Payer: Self-pay | Admitting: Internal Medicine

## 2013-12-09 ENCOUNTER — Encounter: Payer: Self-pay | Admitting: Internal Medicine

## 2014-03-09 ENCOUNTER — Other Ambulatory Visit: Payer: Self-pay

## 2014-03-09 DIAGNOSIS — Z1231 Encounter for screening mammogram for malignant neoplasm of breast: Secondary | ICD-10-CM

## 2014-03-29 ENCOUNTER — Ambulatory Visit
Admission: RE | Admit: 2014-03-29 | Discharge: 2014-03-29 | Disposition: A | Discharge status: Home / Self Care | Payer: Managed Care, Other (non HMO) | Source: Ambulatory Visit

## 2014-03-29 ENCOUNTER — Other Ambulatory Visit: Payer: Self-pay

## 2014-03-29 DIAGNOSIS — Z1231 Encounter for screening mammogram for malignant neoplasm of breast: Secondary | ICD-10-CM

## 2014-06-22 ENCOUNTER — Telehealth (INDEPENDENT_AMBULATORY_CARE_PROVIDER_SITE_OTHER): Payer: Self-pay | Admitting: *Deleted

## 2014-06-22 ENCOUNTER — Other Ambulatory Visit (INDEPENDENT_AMBULATORY_CARE_PROVIDER_SITE_OTHER): Payer: Self-pay | Admitting: *Deleted

## 2014-06-22 DIAGNOSIS — Z1211 Encounter for screening for malignant neoplasm of colon: Secondary | ICD-10-CM

## 2014-06-22 DIAGNOSIS — Z8719 Personal history of other diseases of the digestive system: Secondary | ICD-10-CM

## 2014-06-22 MED ORDER — SOD PHOS MONO-SOD PHOS DIBASIC 1.102-0.398 G PO TABS: 32.0000 | ORAL_TABLET | Freq: Once | ORAL | Status: DC

## 2014-06-22 NOTE — Telephone Encounter (Signed)
Patient needs osmo pill prep 

## 2014-07-03 ENCOUNTER — Encounter (HOSPITAL_COMMUNITY): Payer: Self-pay | Admitting: *Deleted

## 2014-07-03 ENCOUNTER — Encounter (HOSPITAL_COMMUNITY)
Admission: RE | Disposition: A | Discharge status: Home / Self Care | Payer: Self-pay | Source: Ambulatory Visit | Attending: Internal Medicine

## 2014-07-03 ENCOUNTER — Ambulatory Visit (HOSPITAL_COMMUNITY)
Admission: RE | Admit: 2014-07-03 | Discharge: 2014-07-03 | Disposition: A | Discharge status: Home / Self Care | Payer: Managed Care, Other (non HMO) | Source: Ambulatory Visit | Attending: Internal Medicine | Admitting: Internal Medicine

## 2014-07-03 DIAGNOSIS — M1990 Unspecified osteoarthritis, unspecified site: Secondary | ICD-10-CM | POA: Insufficient documentation

## 2014-07-03 DIAGNOSIS — K5730 Diverticulosis of large intestine without perforation or abscess without bleeding: Secondary | ICD-10-CM | POA: Insufficient documentation

## 2014-07-03 DIAGNOSIS — K644 Residual hemorrhoidal skin tags: Secondary | ICD-10-CM | POA: Diagnosis not present

## 2014-07-03 DIAGNOSIS — Z8719 Personal history of other diseases of the digestive system: Secondary | ICD-10-CM

## 2014-07-03 DIAGNOSIS — Z79899 Other long term (current) drug therapy: Secondary | ICD-10-CM | POA: Insufficient documentation

## 2014-07-03 DIAGNOSIS — D123 Benign neoplasm of transverse colon: Secondary | ICD-10-CM | POA: Diagnosis not present

## 2014-07-03 DIAGNOSIS — K649 Unspecified hemorrhoids: Secondary | ICD-10-CM

## 2014-07-03 DIAGNOSIS — E039 Hypothyroidism, unspecified: Secondary | ICD-10-CM | POA: Insufficient documentation

## 2014-07-03 DIAGNOSIS — Z86010 Personal history of colonic polyps: Secondary | ICD-10-CM

## 2014-07-03 DIAGNOSIS — Z09 Encounter for follow-up examination after completed treatment for conditions other than malignant neoplasm: Secondary | ICD-10-CM | POA: Diagnosis present

## 2014-07-03 HISTORY — DX: Hypothyroidism, unspecified: E03.9

## 2014-07-03 HISTORY — DX: Unspecified osteoarthritis, unspecified site: M19.90

## 2014-07-03 HISTORY — PX: COLONOSCOPY: SHX5424

## 2014-07-03 SURGERY — COLONOSCOPY
Anesthesia: Moderate Sedation

## 2014-07-03 MED ORDER — MEPERIDINE HCL 50 MG/ML IJ SOLN
INTRAMUSCULAR | Status: DC | PRN
Start: 2014-07-03 — End: 2014-07-03
  Administered 2014-07-03 (×2): 25 mg via INTRAVENOUS

## 2014-07-03 MED ORDER — MIDAZOLAM HCL 5 MG/5ML IJ SOLN
INTRAMUSCULAR | Status: AC
  Filled 2014-07-03: qty 10

## 2014-07-03 MED ORDER — MIDAZOLAM HCL 5 MG/5ML IJ SOLN
INTRAMUSCULAR | Status: DC | PRN
  Administered 2014-07-03 (×3): 2 mg via INTRAVENOUS

## 2014-07-03 MED ORDER — STERILE WATER FOR IRRIGATION IR SOLN
Status: DC | PRN
  Administered 2014-07-03: 11:00:00

## 2014-07-03 MED ORDER — SODIUM CHLORIDE 0.9 % IV SOLN
INTRAVENOUS | Status: DC
  Administered 2014-07-03: 1000 mL via INTRAVENOUS

## 2014-07-03 MED ORDER — MEPERIDINE HCL 50 MG/ML IJ SOLN
INTRAMUSCULAR | Status: AC
  Filled 2014-07-03: qty 1

## 2014-07-03 NOTE — H&P (Signed)
Sherri Saunders is an 58 y.o. female.   Chief Complaint: Patient is here for colonoscopy. HPI: Patient is 58 year old Caucasian female who was diagnosed and treated for diverticulitis last month. She had an episode 2 years ago. She also has history of colonic adenoma. Last colonoscopy was in November 2009. Presently she is asymptomatic. She denies abdominal pain rectal bleeding or melena. She believes her mother metastatic colon carcinoma at age 63 diagnosis not confirmed.   Past Medical History  Diagnosis Date  . Diverticulosis   . Hypothyroidism   . Arthritis     Past Surgical History  Procedure Laterality Date  . Laparoscopic cholecystectomy  08/2008  . Colonoscopy  2009    Dr. Olevia Perches    Family History  Problem Relation Age of Onset  . Hypertension Mother   . Diabetes Sister   . Hypertension Sister   . Healthy Son   . Healthy Son    Social History:  reports that she has never smoked. She has never used smokeless tobacco. She reports that she does not drink alcohol or use illicit drugs.  Allergies: No Known Allergies  Medications Prior to Admission  Medication Sig Dispense Refill  . calcium-vitamin D (OSCAL WITH D) 500-200 MG-UNIT per tablet Take 1 tablet by mouth 2 (two) times daily.      . sodium phosphates (OSMOPREP) 1.102-0.398 G TABS Take 32 tablets by mouth once. 32 tablet 0    No results found for this or any previous visit (from the past 48 hour(s)). No results found.  ROS  Blood pressure 135/87, pulse 63, temperature 97.4 F (36.3 C), temperature source Oral, resp. rate 14, height 5\' 6"  (1.676 m), weight 165 lb (74.844 kg), SpO2 100 %. Physical Exam  Constitutional: She appears well-developed and well-nourished.  HENT:  Mouth/Throat: Oropharynx is clear and moist.  Eyes: Conjunctivae are normal. No scleral icterus.  Neck: No thyromegaly present.  Cardiovascular: Normal rate, regular rhythm and normal heart sounds.   No murmur heard. Respiratory:  Effort normal and breath sounds normal.  GI: Soft. She exhibits no distension and no mass. There is no tenderness.  Musculoskeletal: She exhibits no edema.  Lymphadenopathy:    She has no cervical adenopathy.  Neurological: She is alert.  Skin: Skin is warm and dry.     Assessment/Plan History of diverticulitis. History of colonic adenoma. Diagnostic colonoscopy.  REHMAN,NAJEEB U 07/03/2014, 11:12 AM

## 2014-07-03 NOTE — Op Note (Signed)
COLONOSCOPY PROCEDURE REPORT  PATIENT:  Sherri Saunders  MR#:  [id number] Birthdate:  [birth year], 58 y.o., female Endoscopist:  Dr. Rogene Houston, MD  Procedure Date: 07/03/2014  Procedure:   Colonoscopy  Indications:  Patient is 58 year old Caucasian female was history of colonic adenoma was recently treated for diverticulitis. She is presently asymptomatic. She is undergoing colonoscopy primarily for diagnosis but she is also due for surveillance colonoscopy. Last colonoscopy was in November 2009 by Dr. Delfin Edis of [facility] with removal of two small polyps.(One polyp was lost other one was tubular adenoma).     Informed Consent:  The procedure and risks were reviewed with the patient and informed consent was obtained.  Medications:  Demerol 50 mg IV Versed 6 mg IV  Description of procedure:  After a digital rectal exam was performed, that colonoscope was advanced from the anus through the rectum and colon to the area of the cecum, ileocecal valve and appendiceal orifice. The cecum was deeply intubated. These structures were well-seen and photographed for the record. From the level of the cecum and ileocecal valve, the scope was slowly and cautiously withdrawn. The mucosal surfaces were carefully surveyed utilizing scope tip to flexion to facilitate fold flattening as needed. The scope was pulled down into the rectum where a thorough exam including retroflexion was performed.  Findings:   Prep satisfactory. 4 mm polyp ablated via cold biopsy from hepatic flexure. Scattered small to medium size diverticula noted at descending and sigmoid colon. Normal rectal mucosa. Small hemorrhoids below the dentate line.   Therapeutic/Diagnostic Maneuvers Performed:  See above  Complications:  None  Cecal Withdrawal Time:  14 minutes  Impression:  Examination performed to cecum. Small polyp ablated via cold biopsy from hepatic flexure. Left-sided  diverticulosis. Small external hemorrhoids.  Recommendations:  Standard instructions given. High fiber diet. I will contact patient with biopsy results and further recommendations.  REHMAN,NAJEEB U  07/03/2014 11:51 AM  CC: Dr. Neale Burly, MD & Dr. Rayne Du ref. provider found

## 2014-07-03 NOTE — Discharge Instructions (Signed)
Resume usual medications and high fiber diet. °No driving for 24 hours. °Physician will call with biopsy results ° ° °High-Fiber Diet °Fiber is found in fruits, vegetables, and grains. A high-fiber diet encourages the addition of more whole grains, legumes, fruits, and vegetables in your diet. The recommended amount of fiber for adult males is 38 g per day. For adult females, it is 25 g per day. Pregnant and lactating women should get 28 g of fiber per day. If you have a digestive or bowel problem, ask your caregiver for advice before adding high-fiber foods to your diet. Eat a variety of high-fiber foods instead of only a select few type of foods.  °PURPOSE °· To increase stool bulk. °· To make bowel movements more regular to prevent constipation. °· To lower cholesterol. °· To prevent overeating. °WHEN IS THIS DIET USED? °· It may be used if you have constipation and hemorrhoids. °· It may be used if you have uncomplicated diverticulosis (intestine condition) and irritable bowel syndrome. °· It may be used if you need help with weight management. °· It may be used if you want to add it to your diet as a protective measure against atherosclerosis, diabetes, and cancer. °SOURCES OF FIBER °· Whole-grain breads and cereals. °· Fruits, such as apples, oranges, bananas, berries, prunes, and pears. °· Vegetables, such as green peas, carrots, sweet potatoes, beets, broccoli, cabbage, spinach, and artichokes. °· Legumes, such split peas, soy, lentils. °· Almonds. °FIBER CONTENT IN FOODS °Starches and Grains / Dietary Fiber (g) °· Cheerios, 1 cup / 3 g °· Corn Flakes cereal, 1 cup / 0.7 g °· Rice crispy treat cereal, 1¼ cup / 0.3 g °· Instant oatmeal (cooked), ½ cup / 2 g °· Frosted wheat cereal, 1 cup / 5.1 g °· Brown, long-grain rice (cooked), 1 cup / 3.5 g °· White, long-grain rice (cooked), 1 cup / 0.6 g °· Enriched macaroni (cooked), 1 cup / 2.5 g °Legumes / Dietary Fiber (g) °· Baked beans (canned, plain, or  vegetarian), ½ cup / 5.2 g °· Kidney beans (canned), ½ cup / 6.8 g °· Pinto beans (cooked), ½ cup / 5.5 g °Breads and Crackers / Dietary Fiber (g) °· Plain or honey graham crackers, 2 squares / 0.7 g °· Saltine crackers, 3 squares / 0.3 g °· Plain, salted pretzels, 10 pieces / 1.8 g °· Whole-wheat bread, 1 slice / 1.9 g °· White bread, 1 slice / 0.7 g °· Raisin bread, 1 slice / 1.2 g °· Plain bagel, 3 oz / 2 g °· Flour tortilla, 1 oz / 0.9 g °· Corn tortilla, 1 small / 1.5 g °· Hamburger or hotdog bun, 1 small / 0.9 g °Fruits / Dietary Fiber (g) °· Apple with skin, 1 medium / 4.4 g °· Sweetened applesauce, ½ cup / 1.5 g °· Banana, ½ medium / 1.5 g °· Grapes, 10 grapes / 0.4 g °· Orange, 1 small / 2.3 g °· Raisin, 1.5 oz / 1.6 g °· Melon, 1 cup / 1.4 g °Vegetables / Dietary Fiber (g) °· Green beans (canned), ½ cup / 1.3 g °· Carrots (cooked), ½ cup / 2.3 g °· Broccoli (cooked), ½ cup / 2.8 g °· Peas (cooked), ½ cup / 4.4 g °· Mashed potatoes, ½ cup / 1.6 g °· Lettuce, 1 cup / 0.5 g °· Corn (canned), ½ cup / 1.6 g °· Tomato, ½ cup / 1.1 g °Document Released: 06/29/2005 Document Revised: 12/29/2011 Document Reviewed: 10/01/2011 °[employer]® Patient   Information ©2015 [employer]. This information is not intended to replace advice given to you by your health care provider. Make sure you discuss any questions you have with your health care provider. °Colonoscopy, Care After °These instructions give you information on caring for yourself after your procedure. Your doctor may also give you more specific instructions. Call your doctor if you have any problems or questions after your procedure. °HOME CARE °· Do not drive for 24 hours. °· Do not sign important papers or use machinery for 24 hours. °· You may shower. °· You may go back to your usual activities, but go slower for the first 24 hours. °· Take rest breaks often during the first 24 hours. °· Walk around or use warm packs on your belly (abdomen) if you have belly  cramping or gas. °· Drink enough fluids to keep your pee (urine) clear or pale yellow. °· Resume your normal diet. Avoid heavy or fried foods. °· Avoid drinking alcohol for 24 hours or as told by your doctor. °· Only take medicines as told by your doctor. °If a tissue sample (biopsy) was taken during the procedure:  °· Do not take aspirin or blood thinners for 7 days, or as told by your doctor. °· Do not drink alcohol for 7 days, or as told by your doctor. °· Eat soft foods for the first 24 hours. °GET HELP IF: °You still have a small amount of blood in your poop (stool) 2-3 days after the procedure. °GET HELP RIGHT AWAY IF: °· You have more than a small amount of blood in your poop. °· You see clumps of tissue (blood clots) in your poop. °· Your belly is puffy (swollen). °· You feel sick to your stomach (nauseous) or throw up (vomit). °· You have a fever. °· You have belly pain that gets worse and medicine does not help. °MAKE SURE YOU: °· Understand these instructions. °· Will watch your condition. °· Will get help right away if you are not doing well or get worse. °Document Released: 08/01/2010 Document Revised: 07/04/2013 Document Reviewed: 03/06/2013 °[employer]® Patient Information ©2015 [employer]. This information is not intended to replace advice given to you by your health care provider. Make sure you discuss any questions you have with your health care provider. ° °

## 2014-07-04 ENCOUNTER — Encounter (HOSPITAL_COMMUNITY): Payer: Self-pay | Admitting: Internal Medicine

## 2014-07-10 ENCOUNTER — Encounter (INDEPENDENT_AMBULATORY_CARE_PROVIDER_SITE_OTHER): Payer: Self-pay | Admitting: *Deleted

## 2014-11-09 ENCOUNTER — Other Ambulatory Visit: Payer: Self-pay | Admitting: Obstetrics and Gynecology

## 2014-11-12 LAB — CYTOLOGY - PAP

## 2014-12-13 ENCOUNTER — Ambulatory Visit (INDEPENDENT_AMBULATORY_CARE_PROVIDER_SITE_OTHER): Payer: Managed Care, Other (non HMO) | Admitting: Internal Medicine

## 2014-12-18 ENCOUNTER — Ambulatory Visit (INDEPENDENT_AMBULATORY_CARE_PROVIDER_SITE_OTHER): Payer: Managed Care, Other (non HMO) | Admitting: Internal Medicine

## 2014-12-18 ENCOUNTER — Encounter (INDEPENDENT_AMBULATORY_CARE_PROVIDER_SITE_OTHER): Payer: Self-pay | Admitting: Internal Medicine

## 2014-12-18 VITALS — BP 118/70 | HR 68 | Temp 98.2°F | Resp 18 | Ht 66.0 in | Wt 163.3 lb

## 2014-12-18 DIAGNOSIS — Z8719 Personal history of other diseases of the digestive system: Secondary | ICD-10-CM | POA: Diagnosis not present

## 2014-12-18 NOTE — Progress Notes (Signed)
Presenting complaint;  Recent bout of diverticulitis.  Subjective:  Sherri Saunders is 59 year old Caucasian female who is here for scheduled visit. She has history of diverticulitis. She was initially treated in 2012. She had another well-documented episode in November last year. She underwent colonoscopy on 07/03/2014 revealing left-sided diverticulosis 4 mm tubular adenoma and external hemorrhoids. She has been on high fiber diet and is doing well until about 3 weeks ago when she developed typical symptoms of diverticulitis and was treated with antibiotic. She states it took 4 days for her to start feeling better. She finished antibiotics last week and presently she is pain-free. She is on probiotic and having normal bowel movements daily. She does not take OTC NSAIDs. She denies melena or rectal bleeding. She walks and swims regularly. Her mother died at age 64. She was in the hospital for 1 month and underwent surgery and she believes she either had complicated diverticulitis or CRC. Her mother rash and family was never whited with accurate diagnosis.   Current Medications: Outpatient Encounter Prescriptions as of 12/18/2014  Medication Sig  . calcium-vitamin D (OSCAL WITH D) 500-200 MG-UNIT per tablet Take 1 tablet by mouth 2 (two) times daily.    . Probiotic Product (PROBIOTIC DAILY PO) Take by mouth 2 (two) times daily.   No facility-administered encounter medications on file as of 12/18/2014.     Objective: Blood pressure 118/70, pulse 68, temperature 98.2 F (36.8 C), temperature source Oral, resp. rate 18, height 5\' 6"  (1.676 m), weight 163 lb 4.8 oz (74.072 kg). Patient is alert and in no acute distress. Conjunctiva is pink. Sclera is nonicteric Oropharyngeal mucosa is normal. No neck masses or thyromegaly noted. Cardiac exam with regular rhythm normal S1 and S2. No murmur or gallop noted. Lungs are clear to auscultation. Abdomen is symmetrical soft and nontender without organomegaly or  masses. No LE edema or clubbing noted.   Assessment:  #1. Recent bout of diverticulitis with full recovery. She has had three unequivocal episodes of diverticulitis. She has no risk factors such as NSAID use. For now will monitor her symptoms closely. If she has another episode or two of diverticulitis elective surgery would be considered. #2. History of colonic adenomas. She had colonoscopy in December 2015 and next one would be due in December 2020.  Plan:  Continue high fiber diet. Continue probiotic 1 capsule by mouth daily. Patient will need to recall if she has recurrent symptoms. Office visit on as-needed basis.

## 2014-12-18 NOTE — Patient Instructions (Signed)
Notify if symptoms recur.

## 2015-06-14 ENCOUNTER — Other Ambulatory Visit: Payer: Self-pay

## 2015-06-14 DIAGNOSIS — Z1231 Encounter for screening mammogram for malignant neoplasm of breast: Secondary | ICD-10-CM

## 2015-07-05 ENCOUNTER — Ambulatory Visit: Payer: Managed Care, Other (non HMO)

## 2015-07-24 ENCOUNTER — Ambulatory Visit
Admission: RE | Admit: 2015-07-24 | Discharge: 2015-07-24 | Disposition: A | Discharge status: Home / Self Care | Payer: Managed Care, Other (non HMO) | Source: Ambulatory Visit

## 2015-07-24 DIAGNOSIS — Z1231 Encounter for screening mammogram for malignant neoplasm of breast: Secondary | ICD-10-CM

## 2016-12-15 ENCOUNTER — Other Ambulatory Visit (HOSPITAL_COMMUNITY)
Admission: RE | Admit: 2016-12-15 | Discharge: 2016-12-15 | Disposition: A | Discharge status: Home / Self Care | Payer: Commercial Managed Care - PPO | Source: Ambulatory Visit | Attending: Obstetrics and Gynecology | Admitting: Obstetrics and Gynecology

## 2016-12-15 ENCOUNTER — Other Ambulatory Visit: Payer: Self-pay | Admitting: Obstetrics and Gynecology

## 2016-12-15 DIAGNOSIS — Z01419 Encounter for gynecological examination (general) (routine) without abnormal findings: Secondary | ICD-10-CM | POA: Diagnosis present

## 2016-12-15 DIAGNOSIS — Z1151 Encounter for screening for human papillomavirus (HPV): Secondary | ICD-10-CM | POA: Diagnosis not present

## 2016-12-17 LAB — CYTOLOGY - PAP
Diagnosis: NEGATIVE
HPV: NOT DETECTED

## 2019-06-06 ENCOUNTER — Encounter (INDEPENDENT_AMBULATORY_CARE_PROVIDER_SITE_OTHER): Payer: Self-pay | Admitting: *Deleted

## 2020-05-08 ENCOUNTER — Encounter (INDEPENDENT_AMBULATORY_CARE_PROVIDER_SITE_OTHER): Payer: Self-pay | Admitting: *Deleted

## 2020-06-03 ENCOUNTER — Telehealth (INDEPENDENT_AMBULATORY_CARE_PROVIDER_SITE_OTHER): Payer: Self-pay

## 2020-06-03 ENCOUNTER — Other Ambulatory Visit (INDEPENDENT_AMBULATORY_CARE_PROVIDER_SITE_OTHER): Payer: Self-pay

## 2020-06-03 ENCOUNTER — Encounter (INDEPENDENT_AMBULATORY_CARE_PROVIDER_SITE_OTHER): Payer: Self-pay

## 2020-06-03 DIAGNOSIS — Z86010 Personal history of colonic polyps: Secondary | ICD-10-CM

## 2020-06-03 NOTE — Telephone Encounter (Signed)
Patient has coupon card 

## 2020-06-03 NOTE — Telephone Encounter (Signed)
Referring MD/PCP: Posey Pronto   Procedure: Tcs  Reason/Indication:  H/O Polyps  Has patient had this procedure before?  Yes 2017   If so, when, by whom and where?    Is there a family history of colon cancer?  no  Who?  What age when diagnosed?    Is patient diabetic?   yes      Does patient have prosthetic heart valve or mechanical valve?  no  Do you have a pacemaker/defibrillator?  no  Has patient ever had endocarditis/atrial fibrillation? no  Does patient use oxygen? no  Has patient had joint replacement within last 12 months?  no  Is patient constipated or do they take laxatives? no  Does patient have a history of alcohol/drug use?  no  Is patient on blood thinner such as Coumadin, Plavix and/or Aspirin? no  Medications: hctz 25 mg daily, metformin 1000 mg daily  Allergies: nkda  Medication Adjustment per Dr Laural Golden Hold Metformin evening before and morning of procedure  Procedure date & time: 06/27/20 2:00

## 2020-06-11 MED ORDER — PLENVU 140 G PO SOLR
280.0000 g | Freq: Once | ORAL | 0 refills | Status: AC
Start: 2020-06-11 — End: 2020-06-11

## 2020-06-25 ENCOUNTER — Telehealth (INDEPENDENT_AMBULATORY_CARE_PROVIDER_SITE_OTHER): Payer: Self-pay | Admitting: Internal Medicine

## 2020-06-25 NOTE — Telephone Encounter (Signed)
Patient left message stating she is having to cancel her colonoscopy because of her insurance - ph# [phone]

## 2020-06-25 NOTE — Telephone Encounter (Signed)
Noted thank you

## 2020-06-26 ENCOUNTER — Other Ambulatory Visit (HOSPITAL_COMMUNITY): Admission: RE | Admit: 2020-06-26 | Payer: PRIVATE HEALTH INSURANCE | Source: Ambulatory Visit

## 2020-06-27 ENCOUNTER — Encounter (HOSPITAL_COMMUNITY): Admission: RE | Payer: Self-pay | Source: Home / Self Care

## 2020-06-27 ENCOUNTER — Ambulatory Visit (HOSPITAL_COMMUNITY)
Admission: RE | Admit: 2020-06-27 | Payer: PRIVATE HEALTH INSURANCE | Source: Home / Self Care | Admitting: Internal Medicine

## 2020-06-27 SURGERY — COLONOSCOPY
Anesthesia: Moderate Sedation
